# Patient Record
Sex: Male | Born: 1955 | Race: Black or African American | Hispanic: No | Marital: Married
Health system: Midwestern US, Community
[De-identification: ages and names within clinical notes are randomized; demographics above are authoritative.]

## PROBLEM LIST (undated history)

## (undated) DIAGNOSIS — Z8719 Personal history of other diseases of the digestive system: Secondary | ICD-10-CM

## (undated) HISTORY — PX: OTHER SURGICAL HISTORY: SHX169

## (undated) HISTORY — PX: COLONOSCOPY: SHX5424

---

## 2012-05-21 MED ORDER — OMEPRAZOLE 40 MG CAP, DELAYED RELEASE
40 mg | ORAL_CAPSULE | Freq: Every day | ORAL | Status: DC
Start: 2012-05-21 — End: 2015-10-23

## 2012-05-21 MED ORDER — SILDENAFIL 50 MG TAB
50 mg | ORAL_TABLET | ORAL | Status: DC | PRN
Start: 2012-05-21 — End: 2015-10-23

## 2012-05-21 NOTE — Progress Notes (Signed)
Establish care with Dr. Hayes.
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
## 2012-07-25 NOTE — Progress Notes (Signed)
Quick Note:    Advised pt per Dr.Hayes' note regarding recent xray. Patient verbalized understanding.  ______

## 2012-07-25 NOTE — Progress Notes (Signed)
Follow up for back pain.

## 2012-07-25 NOTE — Progress Notes (Signed)
Quick Note:    Please call patient. Xray normal, no fx.  ______

## 2012-07-25 NOTE — Progress Notes (Signed)
HISTORY OF PRESENT ILLNESS  Parker Warner is a 56 y.o. male.  HPI  GI Review  Patient has episodic nausea & vomiting.  Since last visit did receive previous PCP records.  He can't remember who the GI physician is.      Orthopedic Review  Parker Warner is a 56 y.o. male who presents do to chronic upper back/neck pain. This is a chronic problem, since last visit his previous PCP notes reviewed.  Little information regarding his back issue.  He reports symptoms are unchanged in the last 2 months.  Pain is sharp and located in upper/mid back.  Symptoms are intermittent.  Alleviating factors identifiable by patient are Celebrex (used samples his wife has).  He has used Aleve & Ibuprofen & Tylenol with sporadic improvement.  He reports having tried PT, last time about 3 yrs ago.      Endocrine Review  He is here to talk about hypogonadism.  He is on the following replacement medications: none and is experiencing the following side effects: none.  He was on T shots x 2.  He reports: no change in his libido or ED so stopped.  Lab review: labs reviewed and discussed with patient.         Current Outpatient Prescriptions   Medication Sig Dispense Refill   ??? sildenafil citrate (VIAGRA) 50 mg tablet Take 1 Tab by mouth as needed.  6 Tab  1   ??? omeprazole (PRILOSEC) 40 mg capsule Take 1 Cap by mouth daily.  30 Cap  2       No Known Allergies        Review of Systems   Constitutional: Negative for weight loss and malaise/fatigue.   Respiratory: Negative for cough and shortness of breath.    Cardiovascular: Negative for chest pain and palpitations.   Gastrointestinal: Positive for nausea and vomiting. Negative for abdominal pain, diarrhea, constipation, blood in stool and melena.   Psychiatric/Behavioral: Negative for depression. The patient is not nervous/anxious and does not have insomnia.        Physical Exam   Cardiovascular: Regular rhythm and normal heart sounds.    No murmur heard.  Pulmonary/Chest: Effort normal and breath  sounds normal. He has no wheezes. He has no rales.   Abdominal: Soft. Bowel sounds are normal. He exhibits no mass. There is no hepatosplenomegaly. There is no tenderness.   Musculoskeletal:        Thoracic back: He exhibits bony tenderness. He exhibits normal range of motion, no tenderness, no pain and no spasm.        Back:        BP 118/72   Pulse 62   Ht 5' 11.75" (1.822 m)   Wt 231 lb (104.781 kg)   BMI 31.55 kg/m2   SpO2 98%      ASSESSMENT and PLAN  Parker Warner was seen today for back pain.  Diagnoses and associated orders for this visit:    Vomiting- stable, pt unsure of previous GI he has seen, will try and call around to get records    Thoracic spine pain- chronic, no previous records to review, will start meds below & check labs  - celecoxib (CELEBREX) 200 mg capsule; Take 1 Cap by mouth two (2) times a day.  - celecoxib (CELEBREX) 200 mg capsule; Take 1 Cap by mouth two (2) times a day for 14 days.  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  Will hold off on retrying meds.  He would like to try Viagra first.  Will get labs & f/u at next visit.        Follow-up Disposition:  Return in about 3 months (around 10/24/2012) for FULL PHYSICAL.   Advised him to call back or return to office if symptoms worsen/change/persist.  Discussed expected course/resolution/complications of diagnosis in detail with patient.    Medication risks/benefits/costs/interactions/alternatives discussed with patient.  He was given an after visit summary which includes diagnoses, current medications, & vitals.  He expressed understanding with the diagnosis and plan.

## 2012-07-25 NOTE — Progress Notes (Signed)
Quick Note:    LMTCB  ______

## 2015-10-23 ENCOUNTER — Ambulatory Visit: Admit: 2015-10-23 | Payer: PRIVATE HEALTH INSURANCE | Attending: Internal Medicine

## 2015-10-23 DIAGNOSIS — G8929 Other chronic pain: Secondary | ICD-10-CM

## 2015-10-23 MED ORDER — NAPROXEN 500 MG TAB
500 mg | ORAL_TABLET | Freq: Two times a day (BID) | ORAL | 1 refills | Status: AC
Start: 2015-10-23 — End: ?

## 2015-10-23 MED ORDER — CYCLOBENZAPRINE 5 MG TAB
5 mg | ORAL_TABLET | Freq: Three times a day (TID) | ORAL | 1 refills | Status: AC
Start: 2015-10-23 — End: ?
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
## 2015-10-23 NOTE — Progress Notes (Signed)
To re-establish care. C/O back pain, recurring for past 1-1/2 years. NKI. No other known health issues.

## 2015-10-23 NOTE — Patient Instructions (Signed)
Healthy Upper Back: Exercises  Your Care Instructions  Here are some examples of exercises for your upper back. Start each exercise slowly. Ease off the exercise if you start to have pain.  Your doctor or physical therapist will tell you when you can start these exercises and which ones will work best for you.  How to do the exercises  Lower neck and upper back stretch    1. Stretch your arms out in front of your body. Clasp one hand on top of your other hand.  2. Gently reach out so that you feel your shoulder blades stretching away from each other.  3. Gently bend your head forward.  4. Hold for 15 to 30 seconds.  5. Repeat 2 to 4 times.  Midback stretch    Note: If you have knee pain, do not do this exercise.  1. Kneel on the floor, and sit back on your ankles.  2. Lean forward, place your hands on the floor, and stretch your arms out in front of you. Rest your head between your arms.  3. Gently push your chest toward the floor, reaching as far in front of you as possible.  4. Hold for 15 to 30 seconds.  5. Repeat 2 to 4 times.  Shoulder rolls    1. Sit comfortably with your feet shoulder-width apart. You can also do this exercise while standing.  2. Roll your shoulders up, then back, and then down in a smooth, circular motion.  3. Repeat 2 to 4 times.  Wall push-up    1. Stand against a wall with your feet about 12 to 24 inches back from the wall. If you feel any pain when you do this exercise, stand closer to the wall.  2. Place your hands on the wall slightly wider apart than your shoulders, and lean forward.  3. Gently lean your body toward the wall. Then push back to your starting position. Keep the motion smooth and controlled.  4. Repeat 8 to 12 times.  Resisted shoulder blade squeeze    Note: For this exercise, you will need elastic exercise material, such as surgical tubing or Thera-Band.  1. Sit or stand, holding the band in both hands in front of you. Keep your  elbows close to your sides, bent at a 90-degree angle. Your palms should face up.  2. Squeeze your shoulder blades together, and move your arms to the outside, stretching the band. Be sure to keep your elbows at your sides while you do this.  3. Relax.  4. Repeat 8 to 12 times.  Resisted rows    Note: For this exercise, you will need elastic exercise material, such as surgical tubing or Thera-Band.  1. Put the band around a solid object, such as a bedpost, at about waist level. Hold one end of the band in each hand.  2. With your elbows at your sides and bent to 90 degrees, pull the band back to move your shoulder blades toward each other. Return to the starting position.  3. Repeat 8 to 12 times.  Follow-up care is a key part of your treatment and safety. Be sure to make and go to all appointments, and call your doctor if you are having problems. It's also a good idea to know your test results and keep a list of the medicines you take.  Where can you learn more?  Go to http://www.healthwise.net/[facility]  Enter T680 in the search box to learn more about "Healthy Upper   Back: Exercises."  ?? 2006-2016 [employer]. Care instructions adapted under license by [employer] (which disclaims liability or warranty for this information). This care instruction is for use with your licensed healthcare professional. If you have questions about a medical condition or this instruction, always ask your healthcare professional. [employer] disclaims any warranty or liability for your use of this information.  Content Version: 11.0.578772; Current as of: Apr 06, 2015        Low Back Pain: Exercises  Your Care Instructions  Here are some examples of typical rehabilitation exercises for your condition. Start each exercise slowly. Ease off the exercise if you start to have pain.  Your doctor or physical therapist will tell you when you can start these  exercises and which ones will work best for you.  How to do the exercises  Press-up    6. Lie on your stomach, supporting your body with your forearms.  7. Press your elbows down into the floor to raise your upper back. As you do this, relax your stomach muscles and allow your back to arch without using your back muscles. As your press up, do not let your hips or pelvis come off the floor.  8. Hold for 15 to 30 seconds, then relax.  9. Repeat 2 to 4 times.  Alternate arm and leg (bird dog) exercise    Note: Do this exercise slowly. Try to keep your body straight at all times, and do not let one hip drop lower than the other.  6. Start on the floor, on your hands and knees.  7. Tighten your belly muscles.  8. Raise one leg off the floor, and hold it straight out behind you. Be careful not to let your hip drop down, because that will twist your trunk.  9. Hold for about 6 seconds, then lower your leg and switch to the other leg.  10. Repeat 8 to 12 times on each leg.  11. Over time, work up to holding for 10 to 30 seconds each time.  12. If you feel stable and secure with your leg raised, try raising the opposite arm straight out in front of you at the same time.  Knee-to-chest exercise    4. Lie on your back with your knees bent and your feet flat on the floor.  5. Bring one knee to your chest, keeping the other foot flat on the floor (or keeping the other leg straight, whichever feels better on your lower back).  6. Keep your lower back pressed to the floor. Hold for at least 15 to 30 seconds.  7. Relax, and lower the knee to the starting position.  8. Repeat with the other leg. Repeat 2 to 4 times with each leg.  9. To get more stretch, put your other leg flat on the floor while pulling your knee to your chest.  Curl-ups    5. Lie on the floor on your back with your knees bent at a 90-degree angle. Your feet should be flat on the floor, about 12 inches from your buttocks.   6. Cross your arms over your chest. If this bothers your neck, try putting your hands behind your neck (not your head), with your elbows spread apart.  7. Slowly tighten your belly muscles and raise your shoulder blades off the floor.  8. Keep your head in line with your body, and do not press your chin to your chest.  9. Hold this position for 1 or   2 seconds, then slowly lower yourself back down to the floor.  10. Repeat 8 to 12 times.  Pelvic tilt exercise    5. Lie on your back with your knees bent.  6. "Brace" your stomach. This means to tighten your muscles by pulling in and imagining your belly button moving toward your spine. You should feel like your back is pressing to the floor and your hips and pelvis are rocking back.  7. Hold for about 6 seconds while you breathe smoothly.  8. Repeat 8 to 12 times.  Heel dig bridging    4. Lie on your back with both knees bent and your ankles bent so that only your heels are digging into the floor. Your knees should be bent about 90 degrees.  5. Then push your heels into the floor, squeeze your buttocks, and lift your hips off the floor until your shoulders, hips, and knees are all in a straight line.  6. Hold for about 6 seconds as you continue to breathe normally, and then slowly lower your hips back down to the floor and rest for up to 10 seconds.  7. Do 8 to 12 repetitions.  Hamstring stretch in doorway    1. Lie on your back in a doorway, with one leg through the open door.  2. Slide your leg up the wall to straighten your knee. You should feel a gentle stretch down the back of your leg.  3. Hold the stretch for at least 15 to 30 seconds. Do not arch your back, point your toes, or bend either knee. Keep one heel touching the floor and the other heel touching the wall.  4. Repeat with your other leg.  5. Do 2 to 4 times for each leg.  Hip flexor stretch    1. Kneel on the floor with one knee bent and one leg behind you. Place  your forward knee over your foot. Keep your other knee touching the floor.  2. Slowly push your hips forward until you feel a stretch in the upper thigh of your rear leg.  3. Hold the stretch for at least 15 to 30 seconds. Repeat with your other leg.  4. Do 2 to 4 times on each side.  Wall sit    1. Stand with your back 10 to 12 inches away from a wall.  2. Lean into the wall until your back is flat against it.  3. Slowly slide down until your knees are slightly bent, pressing your lower back into the wall.  4. Hold for about 6 seconds, then slide back up the wall.  5. Repeat 8 to 12 times.  Follow-up care is a key part of your treatment and safety. Be sure to make and go to all appointments, and call your doctor if you are having problems. It's also a good idea to know your test results and keep a list of the medicines you take.  Where can you learn more?  Go to http://www.healthwise.net/GoodHelpConnections  Enter Z938 in the search box to learn more about "Low Back Pain: Exercises."  ?? 2006-2016 [employer]. Care instructions adapted under license by [facility] (which disclaims liability or warranty for this information). This care instruction is for use with your licensed healthcare professional. If you have questions about a medical condition or this instruction, always ask your healthcare professional. [employer] disclaims any warranty or liability for your use of this information.  Content Version: 11.0.578772; Current as of: Apr 06, 2015

## 2017-04-24 ENCOUNTER — Other Ambulatory Visit: Payer: Self-pay | Admitting: General Surgery

## 2017-04-24 NOTE — Pre-Procedure Instructions (Signed)
Cherlynn PoloJerome Cuadrado  04/24/2017      [facility] [phone] - [place] - [address] AT [facility] [address] [place] [address] Phone: [phone] Fax: [phone]    Your procedure is scheduled on June 18.  [facility].M[facility]  Call this number if you have problems the morning of surgery:  [phone]   Remember:  Do not eat food or drink liquids after midnight.  Take these medicines the morning of surgery with A SIP OF WATER Tylenol if needed  Stop taking aspirin, BC's, Goody's, herbal medications, Fish Oil, Ibuprofen, Advil, Motrin,Aleve, Naproxen, Anaprox, Vitamins   Do not wear jewelry, make-up or nail polish.  Do not wear lotions, powders, or perfumes, or deoderant.  Do not shave 48 hours prior to surgery.  Men may shave face and neck.  Do not bring valuables to the hospital.  [facility] is not responsible for any belongings or valuables.  Contacts, dentures or bridgework may not be worn into surgery.  Leave your suitcase in the car.  After surgery it may be brought to your room.  For patients admitted to the hospital, discharge time will be determined by your treatment team.  Patients discharged the day of surgery will not be allowed to drive home.    Special instructions:  [place] - Preparing for Surgery  Before surgery, you can play an important role.  Because skin is not sterile, your skin needs to be as free of germs as possible.  You can reduce the number of germs on you skin by washing with CHG (chlorahexidine gluconate) soap before surgery.  CHG is an antiseptic cleaner which kills germs and bonds with the skin to continue killing germs even after washing.  Please DO NOT use if you have an allergy to CHG or antibacterial soaps.  If your skin becomes reddened/irritated stop using the CHG and inform your nurse when you arrive at Short Stay.  Do not shave (including  legs and underarms) for at least 48 hours prior to the first CHG shower.  You may shave your face.  Please follow these instructions carefully:   1.  Shower with CHG Soap the night before surgery and the  morning of Surgery.  2.  If you choose to wash your hair, wash your hair first as usual with your   normal shampoo.  3.  After you shampoo, rinse your hair and body thoroughly to remove the  Shampoo.  4.  Use CHG as you would any other liquid soap.  You can apply chg directly  to the skin and wash gently with scrungie or a clean washcloth.  5.  Apply the CHG Soap to your body ONLY FROM THE NECK DOWN.   Do not use on open wounds or open sores.  Avoid contact with your eyes,       ears, mouth and genitals (private parts).  Wash genitals (private parts)   with your normal soap.  6.  Wash thoroughly, paying special attention to the area where your surgery will be performed.  7.  Thoroughly rinse your body with warm water from the neck down.  8.  DO NOT shower/wash with your normal soap after using and rinsing off  the CHG Soap.  9.  Pat yourself dry with a clean towel.            10.  Wear  clean pajamas.            11.  Place clean sheets on your bed the night of your first shower and do not  sleep with pets.  Day of Surgery  Do not apply any lotions/deoderants the morning of surgery.  Please wear clean clothes to the hospital/surgery center.     Please read over the following fact sheets that you were given. Pain Booklet, Coughing and Deep Breathing and Surgical Site Infection Prevention

## 2017-04-25 ENCOUNTER — Encounter (HOSPITAL_COMMUNITY): Payer: Self-pay

## 2017-04-25 ENCOUNTER — Encounter (HOSPITAL_COMMUNITY)
Admission: RE | Admit: 2017-04-25 | Discharge: 2017-04-25 | Disposition: A | Discharge status: Home / Self Care | Payer: Federal, State, Local not specified - PPO | Source: Ambulatory Visit | Attending: General Surgery | Admitting: General Surgery

## 2017-04-25 DIAGNOSIS — K449 Diaphragmatic hernia without obstruction or gangrene: Secondary | ICD-10-CM | POA: Diagnosis not present

## 2017-04-25 DIAGNOSIS — Z01812 Encounter for preprocedural laboratory examination: Secondary | ICD-10-CM | POA: Diagnosis not present

## 2017-04-25 HISTORY — DX: Personal history of other diseases of the digestive system: Z87.19

## 2017-04-25 LAB — BASIC METABOLIC PANEL
Anion gap: 5 (ref 5–15)
BUN: 12 mg/dL (ref 6–20)
CALCIUM: 8.8 mg/dL — AB (ref 8.9–10.3)
CO2: 23 mmol/L (ref 22–32)
CREATININE: 1.07 mg/dL (ref 0.61–1.24)
Chloride: 109 mmol/L (ref 101–111)
GFR calc Af Amer: >60 mL/min (ref >60)
GLUCOSE: 96 mg/dL (ref 65–99)
Potassium: 3.7 mmol/L (ref 3.5–5.1)
SODIUM: 137 mmol/L (ref 135–145)

## 2017-04-25 LAB — CBC
HCT: 44.4 % (ref 39.0–52.0)
Hemoglobin: 14.5 g/dL (ref 13.0–17.0)
MCH: 26.9 pg (ref 26.0–34.0)
MCHC: 32.7 g/dL (ref 30.0–36.0)
MCV: 82.2 fL (ref 78.0–100.0)
PLATELETS: 248 10*3/uL (ref 150–400)
RBC: 5.4 MIL/uL (ref 4.22–5.81)
RDW: 15.3 % (ref 11.5–15.5)
WBC: 6.2 10*3/uL (ref 4.0–10.5)

## 2017-04-25 NOTE — Progress Notes (Signed)
Pt now here for 0815 appointment.

## 2017-04-25 NOTE — Progress Notes (Addendum)
PCP is Dr. Renaye RakersVeita Bland- states that Dr Parke SimmersBland is aware he is having surgery.  Denies ever seeing a cardiologist. Denies ever having a stress test, card cath, or echo. Denies any chest pain, fever, cough, or shortness of breath. Instructed to drink Boost by 0330 on the day of surgery- voices understanding. (ERAS protocol)

## 2017-04-25 NOTE — Progress Notes (Signed)
Not here for PAT appointment left message for him to call me back.

## 2017-04-30 NOTE — Anesthesia Preprocedure Evaluation (Addendum)
Anesthesia Evaluation  Patient identified by MRN, date of birth, ID band Patient awake    Reviewed: Allergy & Precautions, H&P , Patient's Chart, lab work & pertinent test results, reviewed documented beta blocker date and time   Airway Mallampati: II  TM Distance: >3 FB Neck ROM: full    Dental no notable dental hx. (+) Dental Advidsory Given, Partial Upper, Missing   Pulmonary Current Smoker,    Pulmonary exam normal breath sounds clear to auscultation       Cardiovascular  Rhythm:regular Rate:Normal     Neuro/Psych    GI/Hepatic hiatal hernia,   Endo/Other    Renal/GU      Musculoskeletal   Abdominal   Peds  Hematology   Anesthesia Other Findings   Reproductive/Obstetrics                           Anesthesia Physical Anesthesia Plan  ASA: II  Anesthesia Plan: General   Post-op Pain Management:    Induction: Intravenous  PONV Risk Score and Plan: 1 and Ondansetron, Dexamethasone and Propofol  Airway Management Planned: Oral ETT  Additional Equipment:   Intra-op Plan:   Post-operative Plan: Extubation in OR  Informed Consent: I have reviewed the patients History and Physical, chart, labs and discussed the procedure including the risks, benefits and alternatives for the proposed anesthesia with the patient or authorized representative who has indicated his/her understanding and acceptance.   Dental Advisory Given  Plan Discussed with: CRNA, Surgeon and Anesthesiologist  Anesthesia Plan Comments: (  )       Anesthesia Quick Evaluation

## 2017-05-01 ENCOUNTER — Ambulatory Visit (HOSPITAL_COMMUNITY): Payer: Federal, State, Local not specified - PPO | Admitting: Anesthesiology

## 2017-05-01 ENCOUNTER — Encounter (HOSPITAL_COMMUNITY): Payer: Self-pay | Admitting: *Deleted

## 2017-05-01 ENCOUNTER — Encounter (HOSPITAL_COMMUNITY)
Admission: RE | Disposition: A | Discharge status: Home / Self Care | Payer: Self-pay | Source: Ambulatory Visit | Attending: General Surgery

## 2017-05-01 ENCOUNTER — Observation Stay (HOSPITAL_COMMUNITY)
Admission: RE | Admit: 2017-05-01 | Discharge: 2017-05-02 | Disposition: A | Discharge status: Home / Self Care | Payer: Federal, State, Local not specified - PPO | Source: Ambulatory Visit | Attending: General Surgery | Admitting: General Surgery

## 2017-05-01 DIAGNOSIS — F17200 Nicotine dependence, unspecified, uncomplicated: Secondary | ICD-10-CM | POA: Diagnosis not present

## 2017-05-01 DIAGNOSIS — K429 Umbilical hernia without obstruction or gangrene: Secondary | ICD-10-CM | POA: Diagnosis not present

## 2017-05-01 HISTORY — PX: INSERTION OF MESH: SHX5868

## 2017-05-01 HISTORY — PX: UMBILICAL HERNIA REPAIR: SHX196

## 2017-05-01 SURGERY — REPAIR, HERNIA, UMBILICAL, LAPAROSCOPIC
Anesthesia: General | Site: Abdomen

## 2017-05-01 MED ORDER — PROPOFOL 10 MG/ML IV BOLUS
INTRAVENOUS | Status: AC
  Filled 2017-05-01: qty 20

## 2017-05-01 MED ORDER — PHENYLEPHRINE HCL 10 MG/ML IJ SOLN
INTRAMUSCULAR | Status: DC | PRN
  Administered 2017-05-01 (×2): 80 ug via INTRAVENOUS

## 2017-05-01 MED ORDER — ONDANSETRON HCL 4 MG/2ML IJ SOLN
INTRAMUSCULAR | Status: DC | PRN
  Administered 2017-05-01: 4 mg via INTRAVENOUS

## 2017-05-01 MED ORDER — ONDANSETRON HCL 4 MG/2ML IJ SOLN: 4.0000 mg | Freq: Four times a day (QID) | INTRAMUSCULAR | Status: DC | PRN

## 2017-05-01 MED ORDER — MIDAZOLAM HCL 2 MG/2ML IJ SOLN
INTRAMUSCULAR | Status: AC
  Filled 2017-05-01: qty 2

## 2017-05-01 MED ORDER — EPHEDRINE SULFATE 50 MG/ML IJ SOLN
INTRAMUSCULAR | Status: DC | PRN
  Administered 2017-05-01: 10 mg via INTRAVENOUS

## 2017-05-01 MED ORDER — METHOCARBAMOL 500 MG PO TABS: 500.0000 mg | ORAL_TABLET | Freq: Four times a day (QID) | ORAL | Status: DC | PRN

## 2017-05-01 MED ORDER — DEXAMETHASONE SODIUM PHOSPHATE 10 MG/ML IJ SOLN
INTRAMUSCULAR | Status: DC | PRN
  Administered 2017-05-01: 10 mg via INTRAVENOUS

## 2017-05-01 MED ORDER — CELECOXIB 200 MG PO CAPS
200.0000 mg | ORAL_CAPSULE | ORAL | Status: AC
  Administered 2017-05-01: 200 mg via ORAL
  Filled 2017-05-01: qty 1

## 2017-05-01 MED ORDER — EPHEDRINE 5 MG/ML INJ
INTRAVENOUS | Status: AC
  Filled 2017-05-01: qty 10

## 2017-05-01 MED ORDER — LACTATED RINGERS IV SOLN
INTRAVENOUS | Status: DC | PRN
  Administered 2017-05-01 (×2): via INTRAVENOUS

## 2017-05-01 MED ORDER — BUPIVACAINE-EPINEPHRINE 0.25% -1:200000 IJ SOLN
INTRAMUSCULAR | Status: DC | PRN
  Administered 2017-05-01: 9 mL

## 2017-05-01 MED ORDER — SODIUM CHLORIDE 0.9 % IV SOLN
INTRAVENOUS | Status: DC
  Administered 2017-05-01: 12:00:00 via INTRAVENOUS

## 2017-05-01 MED ORDER — ACETAMINOPHEN 500 MG PO TABS
1000.0000 mg | ORAL_TABLET | ORAL | Status: AC
  Administered 2017-05-01: 1000 mg via ORAL
  Filled 2017-05-01: qty 2

## 2017-05-01 MED ORDER — MIDAZOLAM HCL 5 MG/5ML IJ SOLN
INTRAMUSCULAR | Status: DC | PRN
  Administered 2017-05-01: 2 mg via INTRAVENOUS

## 2017-05-01 MED ORDER — LIDOCAINE 2% (20 MG/ML) 5 ML SYRINGE
INTRAMUSCULAR | Status: AC
  Filled 2017-05-01: qty 5

## 2017-05-01 MED ORDER — ONDANSETRON 4 MG PO TBDP
4.0000 mg | ORAL_TABLET | Freq: Four times a day (QID) | ORAL | Status: DC | PRN
Start: 2017-05-01 — End: 2017-05-02

## 2017-05-01 MED ORDER — FENTANYL CITRATE (PF) 250 MCG/5ML IJ SOLN
INTRAMUSCULAR | Status: AC
  Filled 2017-05-01: qty 5

## 2017-05-01 MED ORDER — LIDOCAINE HCL (CARDIAC) 20 MG/ML IV SOLN
INTRAVENOUS | Status: DC | PRN
  Administered 2017-05-01: 100 mg via INTRAVENOUS

## 2017-05-01 MED ORDER — KETOROLAC TROMETHAMINE 15 MG/ML IJ SOLN
15.0000 mg | Freq: Four times a day (QID) | INTRAMUSCULAR | Status: DC | PRN
  Administered 2017-05-01: 15 mg via INTRAVENOUS

## 2017-05-01 MED ORDER — OXYCODONE HCL 5 MG PO TABS: 5.0000 mg | ORAL_TABLET | ORAL | Status: DC | PRN

## 2017-05-01 MED ORDER — DEXAMETHASONE SODIUM PHOSPHATE 10 MG/ML IJ SOLN
INTRAMUSCULAR | Status: AC
  Filled 2017-05-01: qty 1

## 2017-05-01 MED ORDER — PHENYLEPHRINE 40 MCG/ML (10ML) SYRINGE FOR IV PUSH (FOR BLOOD PRESSURE SUPPORT)
PREFILLED_SYRINGE | INTRAVENOUS | Status: AC
  Filled 2017-05-01: qty 10

## 2017-05-01 MED ORDER — PROPOFOL 10 MG/ML IV BOLUS
INTRAVENOUS | Status: DC | PRN
  Administered 2017-05-01: 175 mg via INTRAVENOUS

## 2017-05-01 MED ORDER — ENOXAPARIN SODIUM 40 MG/0.4ML ~~LOC~~ SOLN: 40.0000 mg | SUBCUTANEOUS | Status: DC

## 2017-05-01 MED ORDER — CEFAZOLIN SODIUM-DEXTROSE 2-4 GM/100ML-% IV SOLN
2.0000 g | INTRAVENOUS | Status: AC
  Administered 2017-05-01: 2 g via INTRAVENOUS
  Filled 2017-05-01: qty 100

## 2017-05-01 MED ORDER — HYDROMORPHONE HCL 1 MG/ML IJ SOLN
0.2500 mg | INTRAMUSCULAR | Status: DC | PRN
  Administered 2017-05-01: 0.5 mg via INTRAVENOUS

## 2017-05-01 MED ORDER — ONDANSETRON HCL 4 MG/2ML IJ SOLN
INTRAMUSCULAR | Status: AC
  Filled 2017-05-01: qty 2

## 2017-05-01 MED ORDER — HYDROMORPHONE HCL 1 MG/ML IJ SOLN
INTRAMUSCULAR | Status: AC
  Administered 2017-05-01: 0.5 mg via INTRAVENOUS
  Filled 2017-05-01: qty 1

## 2017-05-01 MED ORDER — MORPHINE SULFATE (PF) 4 MG/ML IV SOLN
2.0000 mg | INTRAVENOUS | Status: DC | PRN
  Administered 2017-05-01 – 2017-05-02 (×3): 2 mg via INTRAVENOUS
  Filled 2017-05-01 (×3): qty 1

## 2017-05-01 MED ORDER — KETOROLAC TROMETHAMINE 15 MG/ML IJ SOLN
INTRAMUSCULAR | Status: AC
  Filled 2017-05-01: qty 1

## 2017-05-01 MED ORDER — FENTANYL CITRATE (PF) 100 MCG/2ML IJ SOLN
INTRAMUSCULAR | Status: DC | PRN
  Administered 2017-05-01: 50 ug via INTRAVENOUS
  Administered 2017-05-01: 200 ug via INTRAVENOUS

## 2017-05-01 MED ORDER — 0.9 % SODIUM CHLORIDE (POUR BTL) OPTIME
TOPICAL | Status: DC | PRN
  Administered 2017-05-01: 1000 mL

## 2017-05-01 MED ORDER — ACETAMINOPHEN 500 MG PO TABS
1000.0000 mg | ORAL_TABLET | Freq: Four times a day (QID) | ORAL | Status: DC
  Administered 2017-05-01 – 2017-05-02 (×4): 1000 mg via ORAL
  Filled 2017-05-01 (×4): qty 2

## 2017-05-01 MED ORDER — ROCURONIUM BROMIDE 10 MG/ML (PF) SYRINGE
PREFILLED_SYRINGE | INTRAVENOUS | Status: AC
  Filled 2017-05-01: qty 5

## 2017-05-01 MED ORDER — GABAPENTIN 300 MG PO CAPS
300.0000 mg | ORAL_CAPSULE | ORAL | Status: AC
  Administered 2017-05-01: 300 mg via ORAL
  Filled 2017-05-01: qty 1

## 2017-05-01 MED ORDER — ROCURONIUM BROMIDE 100 MG/10ML IV SOLN
INTRAVENOUS | Status: DC | PRN
  Administered 2017-05-01: 50 mg via INTRAVENOUS

## 2017-05-01 MED ORDER — SUGAMMADEX SODIUM 200 MG/2ML IV SOLN
INTRAVENOUS | Status: DC | PRN
  Administered 2017-05-01: 300 mg via INTRAVENOUS

## 2017-05-01 MED ORDER — BUPIVACAINE-EPINEPHRINE (PF) 0.25% -1:200000 IJ SOLN
INTRAMUSCULAR | Status: AC
  Filled 2017-05-01: qty 30

## 2017-05-01 MED ORDER — GLYCOPYRROLATE 0.2 MG/ML IJ SOLN
INTRAMUSCULAR | Status: DC | PRN
  Administered 2017-05-01: 0.2 mg via INTRAVENOUS

## 2017-05-01 MED ORDER — SIMETHICONE 80 MG PO CHEW: 40.0000 mg | CHEWABLE_TABLET | Freq: Four times a day (QID) | ORAL | Status: DC | PRN

## 2017-05-01 SURGICAL SUPPLY — 50 items
APPLIER CLIP 5 13 M/L LIGAMAX5 (MISCELLANEOUS)
CANISTER SUCT 3000ML PPV (MISCELLANEOUS) IMPLANT
CHLORAPREP W/TINT 26ML (MISCELLANEOUS) ×3 IMPLANT
CLIP APPLIE 5 13 M/L LIGAMAX5 (MISCELLANEOUS) IMPLANT
CLOSURE WOUND 1/2 X4 (GAUZE/BANDAGES/DRESSINGS) ×1
COVER SURGICAL LIGHT HANDLE (MISCELLANEOUS) ×3 IMPLANT
DERMABOND ADVANCED (GAUZE/BANDAGES/DRESSINGS) ×2
DERMABOND ADVANCED .7 DNX12 (GAUZE/BANDAGES/DRESSINGS) ×1 IMPLANT
DEVICE RELIATACK FIXATION (MISCELLANEOUS) ×3 IMPLANT
DEVICE TROCAR PUNCTURE CLOSURE (ENDOMECHANICALS) IMPLANT
DRAPE INCISE IOBAN 66X45 STRL (DRAPES) ×3 IMPLANT
ELECT REM PT RETURN 9FT ADLT (ELECTROSURGICAL) ×3
ELECTRODE REM PT RTRN 9FT ADLT (ELECTROSURGICAL) ×1 IMPLANT
GAUZE SPONGE 2X2 8PLY STRL LF (GAUZE/BANDAGES/DRESSINGS) ×1 IMPLANT
GLOVE BIO SURGEON STRL SZ7 (GLOVE) ×6 IMPLANT
GLOVE BIO SURGEON STRL SZ7.5 (GLOVE) ×3 IMPLANT
GLOVE BIOGEL PI IND STRL 6.5 (GLOVE) ×1 IMPLANT
GLOVE BIOGEL PI IND STRL 7.5 (GLOVE) IMPLANT
GLOVE BIOGEL PI INDICATOR 6.5 (GLOVE) ×2
GLOVE BIOGEL PI INDICATOR 7.5 (GLOVE)
GLOVE INDICATOR 7.5 STRL GRN (GLOVE) ×3 IMPLANT
GLOVE SURG SS PI 6.0 STRL IVOR (GLOVE) ×3 IMPLANT
GOWN STRL REUS W/ TWL LRG LVL3 (GOWN DISPOSABLE) ×3 IMPLANT
GOWN STRL REUS W/TWL LRG LVL3 (GOWN DISPOSABLE) ×6
KIT BASIN OR (CUSTOM PROCEDURE TRAY) ×3 IMPLANT
KIT ROOM TURNOVER OR (KITS) ×3 IMPLANT
MARKER SKIN DUAL TIP RULER LAB (MISCELLANEOUS) ×3 IMPLANT
MESH VENTRALIGHT ST 6IN CRC (Mesh General) ×3 IMPLANT
NEEDLE SPNL 22GX3.5 QUINCKE BK (NEEDLE) ×3 IMPLANT
NS IRRIG 1000ML POUR BTL (IV SOLUTION) ×3 IMPLANT
PAD ARMBOARD 7.5X6 YLW CONV (MISCELLANEOUS) ×3 IMPLANT
RELOAD RELIATACK 10 (MISCELLANEOUS) IMPLANT
RELOAD RELIATACK 5 (MISCELLANEOUS) ×12 IMPLANT
SCISSORS LAP 5X35 DISP (ENDOMECHANICALS) ×3 IMPLANT
SET IRRIG TUBING LAPAROSCOPIC (IRRIGATION / IRRIGATOR) IMPLANT
SHEARS HARMONIC ACE PLUS 36CM (ENDOMECHANICALS) IMPLANT
SLEEVE ENDOPATH XCEL 5M (ENDOMECHANICALS) ×6 IMPLANT
SPONGE GAUZE 2X2 STER 10/PKG (GAUZE/BANDAGES/DRESSINGS) ×2
STRIP CLOSURE SKIN 1/2X4 (GAUZE/BANDAGES/DRESSINGS) ×2 IMPLANT
SUT MNCRL AB 4-0 PS2 18 (SUTURE) IMPLANT
SUT PROLENE 0 CT 1 CR/8 (SUTURE) IMPLANT
SUT VICRYL 0 UR6 27IN ABS (SUTURE) ×3 IMPLANT
TAPE CLOTH SOFT 2X10 (GAUZE/BANDAGES/DRESSINGS) ×3 IMPLANT
TOWEL OR 17X24 6PK STRL BLUE (TOWEL DISPOSABLE) ×3 IMPLANT
TOWEL OR 17X26 10 PK STRL BLUE (TOWEL DISPOSABLE) IMPLANT
TRAY FOLEY W/METER SILVER 14FR (SET/KITS/TRAYS/PACK) ×3 IMPLANT
TRAY LAPAROSCOPIC MC (CUSTOM PROCEDURE TRAY) ×3 IMPLANT
TROCAR XCEL NON-BLD 11X100MML (ENDOMECHANICALS) IMPLANT
TROCAR XCEL NON-BLD 5MMX100MML (ENDOMECHANICALS) ×3 IMPLANT
TUBING INSUFFLATION (TUBING) ×3 IMPLANT

## 2017-05-01 NOTE — Discharge Instructions (Signed)
[facility] LAPAROSCOPIC SURGERY: POST OP INSTRUCTIONS  Always review your discharge instruction sheet given to you by the facility where your surgery was performed. IF YOU HAVE DISABILITY OR FAMILY LEAVE FORMS, YOU MUST BRING THEM TO THE OFFICE FOR PROCESSING.   DO NOT GIVE THEM TO YOUR DOCTOR.  1. A prescription for pain medication may be given to you upon discharge.  Take your pain medication as prescribed, if needed.  If narcotic pain medicine is not needed, then you may take acetaminophen (Tylenol), naprosyn (Alleve), or ibuprofen (Advil) as needed. 2. Take your usually prescribed medications unless otherwise directed. 3. If you need a refill on your pain medication, please contact your pharmacy.  They will contact our office to request authorization. Prescriptions will not be filled after 5pm or on week-ends. 4. You should follow a light diet the first few days after arrival home, such as soup and crackers, etc.  Be sure to include lots of fluids daily. 5. Most patients will experience some swelling and bruising in the area of the incisions.  Ice packs will help.  Swelling and bruising can take several days to resolve.  6. It is common to experience some constipation if taking pain medication after surgery.  Increasing fluid intake and taking a stool softener (such as Colace) will usually help or prevent this problem from occurring.  A mild laxative (Milk of Magnesia or Miralax) should be taken according to package instructions if there are no bowel movements after 48 hours. 7. Unless discharge instructions indicate otherwise, you may remove your bandages 48 hours after surgery, and you may shower at that time.  You may have steri-strips (small skin tapes) in place directly over the incision.  These strips should be left on the skin for 7-10 days.  If your surgeon used skin glue on the incision, you may shower in 24 hours.  The glue will flake  off over the next 2-3 weeks.  Any sutures or staples will be removed at the office during your follow-up visit. 8. ACTIVITIES:  You may resume regular (light) daily activities beginning the next day--such as daily self-care, walking, climbing stairs--gradually increasing activities as tolerated.  You may have sexual intercourse when it is comfortable.  Refrain from any heavy lifting or straining until approved by your doctor. a. You may drive when you are no longer taking prescription pain medication, you can comfortably wear a seatbelt, and you can safely maneuver your car and apply brakes. b. RETURN TO WORK:  __________________________________________________________ 9. You should see your doctor in the office for a follow-up appointment approximately 2-3 weeks after your surgery.  Make sure that you call for this appointment within a day or two after you arrive home to insure a convenient appointment time. 10. OTHER INSTRUCTIONS: __________________________________________________________________________________________________________________________ __________________________________________________________________________________________________________________________ WHEN TO CALL YOUR DOCTOR: 1. Fever over 101.0 2. Inability to urinate 3. Continued bleeding from incision. 4. Increased pain, redness, or drainage from the incision. 5. Increasing abdominal pain  The clinic staff is available to answer your questions during regular business hours.  Please don't hesitate to call and ask to speak to one of the nurses for clinical concerns.  If you have a medical emergency, go to the nearest emergency room or call 911.  A surgeon from [facility] is always on call at the hospital. [address], [address], [place], [place]  [address] ? [address] [address], [place], [place]   [address] [phone] ? [phone] ? [phone] Web site: [email or web address]  

## 2017-05-01 NOTE — Transfer of Care (Signed)
Immediate Anesthesia Transfer of Care Note  Patient: Luis Roman  Procedure(s) Performed: Procedure(s): LAPAROSCOPIC UMBILICAL HERNIA REPAIR (N/A) INSERTION OF MESH (N/A)  Patient Location: PACU  Anesthesia Type:General  Level of Consciousness: awake, alert  and oriented  Airway & Oxygen Therapy: Patient Spontanous Breathing and Patient connected to nasal cannula oxygen  Post-op Assessment: Report given to RN, Post -op Vital signs reviewed and stable and Patient moving all extremities X 4  Post vital signs: Reviewed and stable  Last Vitals:  Vitals:   05/01/17 0640 05/01/17 0641  BP:  136/90  Pulse:  (!) 52  Resp:  20  Temp: 36.8 C     Last Pain:  Vitals:   05/01/17 0640  TempSrc: Oral         Complications: No apparent anesthesia complications

## 2017-05-01 NOTE — Anesthesia Procedure Notes (Signed)
Procedure Name: Intubation Date/Time: 05/01/2017 7:39 AM Performed by: Neldon Newport Pre-anesthesia Checklist: Timeout performed, Patient being monitored, Suction available, Emergency Drugs available and Patient identified Patient Re-evaluated:Patient Re-evaluated prior to inductionOxygen Delivery Method: Circle system utilized Preoxygenation: Pre-oxygenation with 100% oxygen Intubation Type: IV induction Ventilation: Oral airway inserted - appropriate to patient size and Mask ventilation without difficulty Laryngoscope Size: Mac, 4 and Glidescope Grade View: Grade III Tube type: Oral Tube size: 7.5 mm Number of attempts: 2 Placement Confirmation: breath sounds checked- equal and bilateral,  positive ETCO2 and ETT inserted through vocal cords under direct vision Secured at: 23 cm Tube secured with: Tape Dental Injury: Teeth and Oropharynx as per pre-operative assessment  Difficulty Due To: Difficult Airway- due to anterior larynx

## 2017-05-01 NOTE — Progress Notes (Signed)
Dr Dwain SarnaWakefield called at [phone] to clarify post op order - pt stated to RN that he was to stay overnight- current order is an enhanced sty- confirmed w/ Dr Dwain SarnaWakefield that order should be changed to an obs status

## 2017-05-01 NOTE — Interval H&P Note (Signed)
History and Physical Interval Note:  05/01/2017 7:14 AM  Luis PoloJerome Roman  has presented today for surgery, with the diagnosis of UMBILICAL HERNIA  The various methods of treatment have been discussed with the patient and family. After consideration of risks, benefits and other options for treatment, the patient has consented to  Procedure(s): LAPAROSCOPIC UMBILICAL HERNIA REPAIR WITH MESH (N/A) as a surgical intervention .  The patient's history has been reviewed, patient examined, no change in status, stable for surgery.  I have reviewed the patient's chart and labs.  Questions were answered to the patient's satisfaction.     Annemarie Sebree

## 2017-05-01 NOTE — H&P (Signed)
  [age] yom referred by Dr Parke SimmersBland for umbilical bulge. he has this for about a month. it is always present. no other medical problems. smokes cigars occasionally. no issues with bms. he is retired.    Past Surgical History  Tonsillectomy   Allergies  No Known Drug Allergies   Medication History No Current Medications Medications Reconciled  Social History  Alcohol use  Occasional alcohol use. Caffeine use  Coffee, Tea. No drug use  Tobacco use  Current some day smoker.  Family History  First Degree Relatives  No pertinent family history   Other Problems  Back Pain    Review of Systems  General Present- Weight Gain. Not Present- Appetite Loss, Chills, Fatigue, Fever, Night Sweats and Weight Loss. Skin Not Present- Change in Wart/Mole, Dryness, Hives, Jaundice, New Lesions, Non-Healing Wounds, Rash and Ulcer. HEENT Present- Hearing Loss. Not Present- Earache, Hoarseness, Nose Bleed, Oral Ulcers, Ringing in the Ears, Seasonal Allergies, Sinus Pain, Sore Throat, Visual Disturbances, Wears glasses/contact lenses and Yellow Eyes. Respiratory Present- Snoring. Not Present- Bloody sputum, Chronic Cough, Difficulty Breathing and Wheezing. Breast Not Present- Breast Mass, Breast Pain, Nipple Discharge and Skin Changes. Cardiovascular Not Present- Chest Pain, Difficulty Breathing Lying Down, Leg Cramps, Palpitations, Rapid Heart Rate, Shortness of Breath and Swelling of Extremities. Gastrointestinal Present- Abdominal Pain. Not Present- Bloating, Bloody Stool, Change in Bowel Habits, Chronic diarrhea, Constipation, Difficulty Swallowing, Excessive gas, Gets full quickly at meals, Hemorrhoids, Indigestion, Nausea, Rectal Pain and Vomiting. Male Genitourinary Not Present- Blood in Urine, Change in Urinary Stream, Frequency, Impotence, Nocturia, Painful Urination, Urgency and Urine Leakage.  Vitals Weight: 258.5 lb Height: 72in Body Surface Area: 2.38 m Body Mass Index:  35.06 kg/m  Temp.: 98.70F(Oral)  Pulse: 62 (Regular)  BP: 140/90 (Sitting, Left Arm, Standard) Physical Exam  General Mental Status-Alert. Orientation-Oriented X3. Chest and Lung Exam Chest and lung exam reveals -on auscultation, normal breath sounds, no adventitious sounds and normal vocal resonance. Cardiovascular Cardiovascular examination reveals -normal heart sounds, regular rate and rhythm with no murmurs. Abdomen Note: soft nt/nd 1-2 cm umbilical hernia mildly tender   Assessment & Plan  UMBILICAL HERNIA (K42.9) Story: Laparoscopic umbilical hernia repair with mesh He feels like he has small hernia but difficult to tell given tenderness and habitus. I think with habitus and activity level that best plan is lap umbo hernia repair with mesh. he also has some diastasis above this. discussed surgery, recovery and risks. We did discuss observation and risks with that but he desires surgery

## 2017-05-01 NOTE — Op Note (Signed)
Preoperative diagnosis: Umbilical hernia and diastasis recti Postoperative diagnosis: Same as above Procedure: Laparoscopic umbilical hernia repair with 15 cm Ventralight mesh Surgeon: Dr. Harden MoMatt Arinze Rivadeneira Anesthesia: Gen. Estimated blood loss: Minimal Specimens: None Drains: None Complications: None Sponge and needle count was correct 2 at end of operation Disposition to recovery in stable condition  Indications: This is a 61 year old male who has a symptomatic umbilical hernia. This is mostly out at this point. It does cause him some discomfort. Due to his body habitus and diastasis recti we discussed a laparoscopic umbilical hernia repair with mesh.  Procedure: After informed consent was obtained the patient was taken to the operating room. He was given antibiotics. SCDs were placed. He was placed under general anesthesia without complication. An orogastric tube and Foley catheter were placed. He was then prepped and draped in the standard sterile surgical fashion. Surgical timeout was then performed.  I infiltrated Marcaine in the left upper quadrant. I made an incision and entered into the peritoneal cavity with a 5 mm Optiview trocar. There was no evidence of an entry injury. I then placed 2 further 5 mm trocars in the left abdomen under direct vision without complication. He was noted to have about a 2 cm hernia with a diastasis above this. I did take down the falciform ligament with cautery in order to place the mesh. I elected to place a 15 cm ventralight mesh. I placed 0 Vicryl sutures in the 4 cardinal positions around the mesh. I then rolled this up and inserted it into my inferior most trocar site. I laid this flat. I used the Endo Close device to then bring the mesh up to the abdominal wall. The sutures were tied down. I used a Reliatack device to then secure the mesh edges all around. I did place an additional suture in the left lateral position. The mesh was in good position. This  adequately covered the defect. Hemostasis is observed. I then removed all the trocars. The abdomen was desufflated. I then closed using 4 Monocryl and glue. He tolerated this well as extubated and transferred to the recovery room stable.

## 2017-05-02 ENCOUNTER — Encounter (HOSPITAL_COMMUNITY): Payer: Self-pay | Admitting: General Surgery

## 2017-05-02 DIAGNOSIS — K429 Umbilical hernia without obstruction or gangrene: Secondary | ICD-10-CM | POA: Diagnosis not present

## 2017-05-02 MED ORDER — METHOCARBAMOL 500 MG PO TABS: 500.0000 mg | ORAL_TABLET | Freq: Four times a day (QID) | ORAL | 0 refills | Status: AC | PRN

## 2017-05-02 MED ORDER — OXYCODONE HCL 5 MG PO TABS: 5.0000 mg | ORAL_TABLET | ORAL | 0 refills | Status: AC | PRN

## 2017-05-02 NOTE — Progress Notes (Signed)
Discharge home. Home discharge instruction given, no question verbalized. 

## 2017-05-02 NOTE — Discharge Summary (Signed)
Physician Discharge Summary  Patient ID: Luis PoloJerome Ridlon MRN: [id number] DOB/AGE: 63/07/1956 63 y.o.  Admit date: 05/01/2017 Discharge date: 05/02/2017  Admission Diagnoses: Umbilical hernia  Discharge Diagnoses:  Active Problems:   Umbilical hernia   Discharged Condition: good  Hospital Course: [age] yom underwent laparoscopic umbilical hernia repair with mesh.  Did well and will be discharged today  Consults: None  Significant Diagnostic Studies: none  Treatments: surgery: lap umbo hernia repair with mesh  Discharge Exam: Blood pressure 132/75, pulse (!) 58, temperature 98.2 F (36.8 C), temperature source Oral, resp. rate 18, height 6' (1.829 m), weight 116.2 kg (256 lb 2.8 oz), SpO2 99 %. GI: soft approp tender incisions clean  Disposition: home  Allergies as of 05/02/2017      Reactions   No Known Allergies       Medication List    TAKE these medications   acetaminophen 325 MG tablet Commonly known as:  TYLENOL Take 325-650 mg by mouth every 6 (six) hours as needed (for pain.).   methocarbamol 500 MG tablet Commonly known as:  ROBAXIN Take 1 tablet (500 mg total) by mouth every 6 (six) hours as needed for muscle spasms.   multivitamin with minerals Tabs tablet Take 1 tablet by mouth 3 (three) times a week.   naproxen sodium 220 MG tablet Commonly known as:  ANAPROX Take 220-440 mg by mouth 2 (two) times daily as needed (for pain.).   oxyCODONE 5 MG immediate release tablet Commonly known as:  Oxy IR/ROXICODONE Take 1 tablet (5 mg total) by mouth every 4 (four) hours as needed for moderate pain.      Follow-up Information    Emelia LoronWakefield, Jacorion Klem, MD In 3 weeks.   Specialty:  General Surgery Contact information: [address] [place] [address] [phone]           Signed: Emelia LoronWAKEFIELD,Darenda Fike 05/02/2017, 7:25 AM

## 2017-05-02 NOTE — Progress Notes (Signed)
Patient ID: Luis Roman, male   DOB: 11/09/1956, 61 y.o.   MRN: [id number] [facility] reviewed day of dc, postop rx given

## 2017-05-08 NOTE — Anesthesia Postprocedure Evaluation (Signed)
Anesthesia Post Note  Patient: Luis Roman  Procedure(s) Performed: Procedure(s) (LRB): LAPAROSCOPIC UMBILICAL HERNIA REPAIR (N/A) INSERTION OF MESH (N/A)     Patient location during evaluation: PACU Anesthesia Type: General Level of consciousness: awake and alert Pain management: pain level controlled Vital Signs Assessment: post-procedure vital signs reviewed and stable Respiratory status: spontaneous breathing, nonlabored ventilation, respiratory function stable and patient connected to nasal cannula oxygen Cardiovascular status: blood pressure returned to baseline and stable Postop Assessment: no signs of nausea or vomiting Anesthetic complications: no    Last Vitals:  Vitals:   05/02/17 0235 05/02/17 0615  BP: (!) 147/84 132/75  Pulse: (!) 53 (!) 58  Resp: 18 18  Temp: 36.6 C 36.8 C    Last Pain:  Vitals:   05/02/17 0908  TempSrc:   PainSc: 3                  Shellene Sweigert EDWARD

## 2017-09-27 ENCOUNTER — Other Ambulatory Visit: Payer: Self-pay | Admitting: Family Medicine

## 2017-09-27 ENCOUNTER — Ambulatory Visit
Admission: RE | Admit: 2017-09-27 | Discharge: 2017-09-27 | Disposition: A | Discharge status: Home / Self Care | Payer: Federal, State, Local not specified - PPO | Source: Ambulatory Visit | Attending: Family Medicine | Admitting: Family Medicine

## 2017-09-27 DIAGNOSIS — M545 Low back pain: Secondary | ICD-10-CM

## 2019-02-07 IMAGING — DX DG LUMBAR SPINE COMPLETE 4+V
5 series · 5 of 5 positions shown · non-contrast
Comparison: None.

CLINICAL DATA: Low back pain.

EXAM:
LUMBAR SPINE - COMPLETE 4+ VIEW

[dg lumbar spine complete 4 +v (1 of 5)]
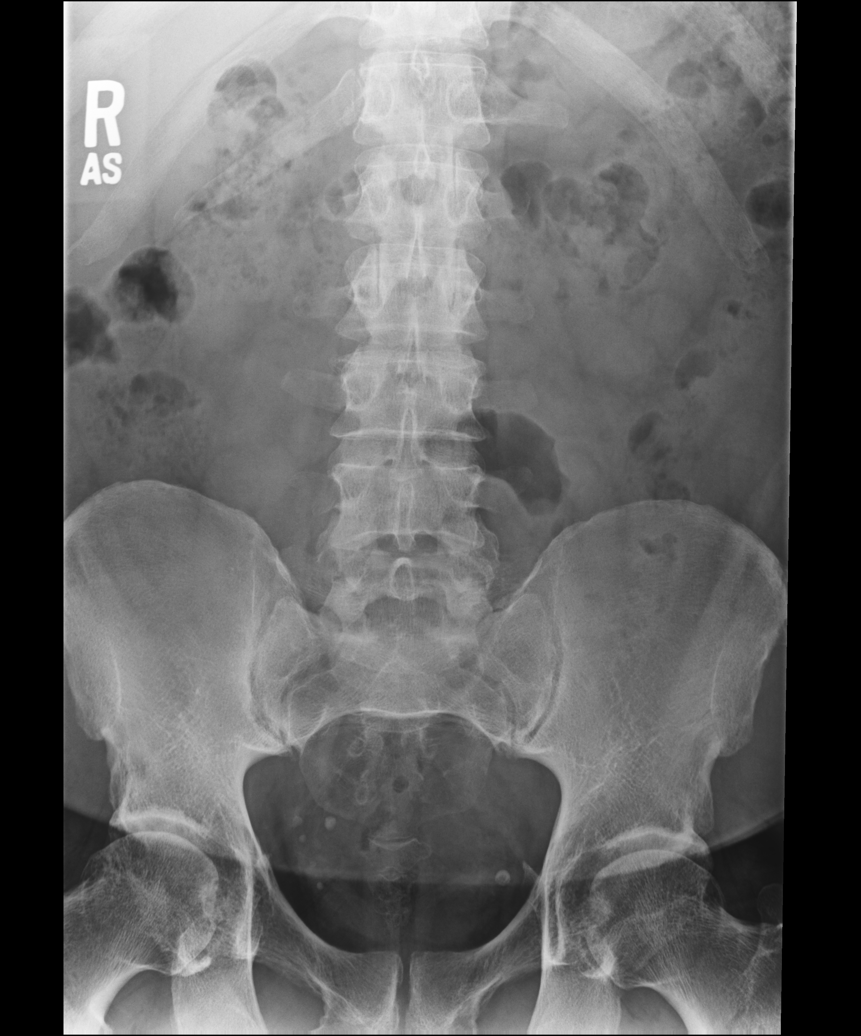

[dg lumbar spine complete 4 +v (2 of 5)]
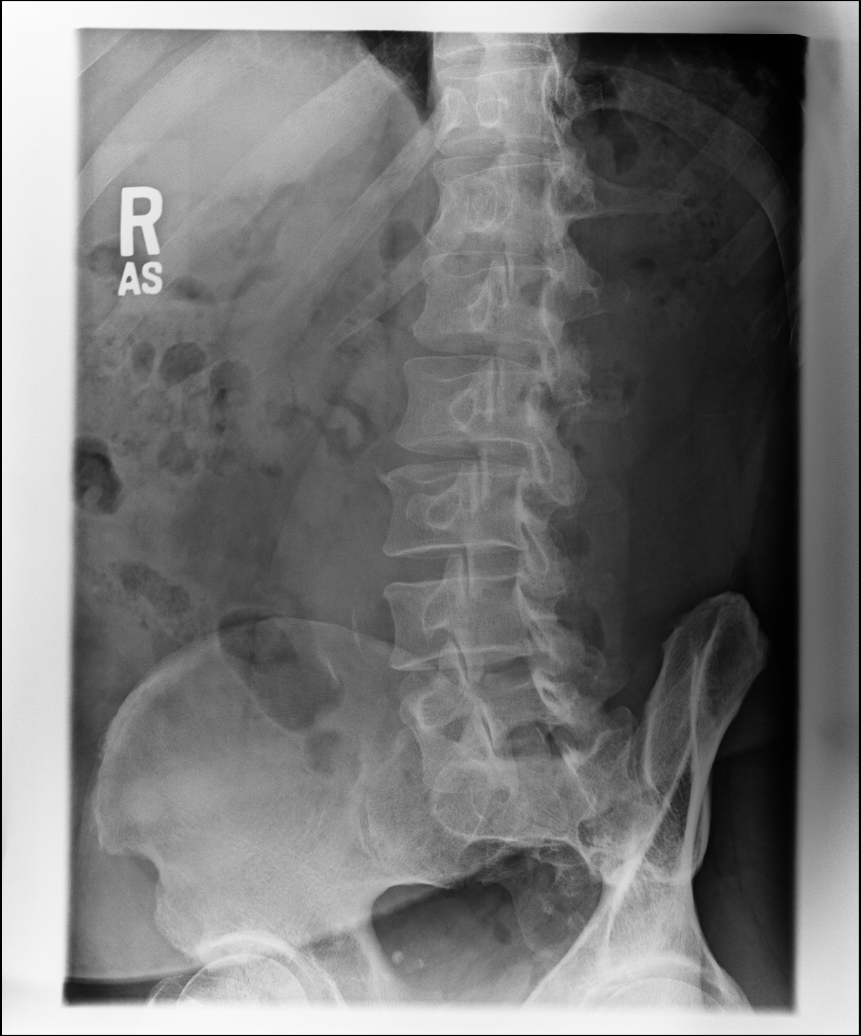

[dg lumbar spine complete 4 +v (3 of 5)]
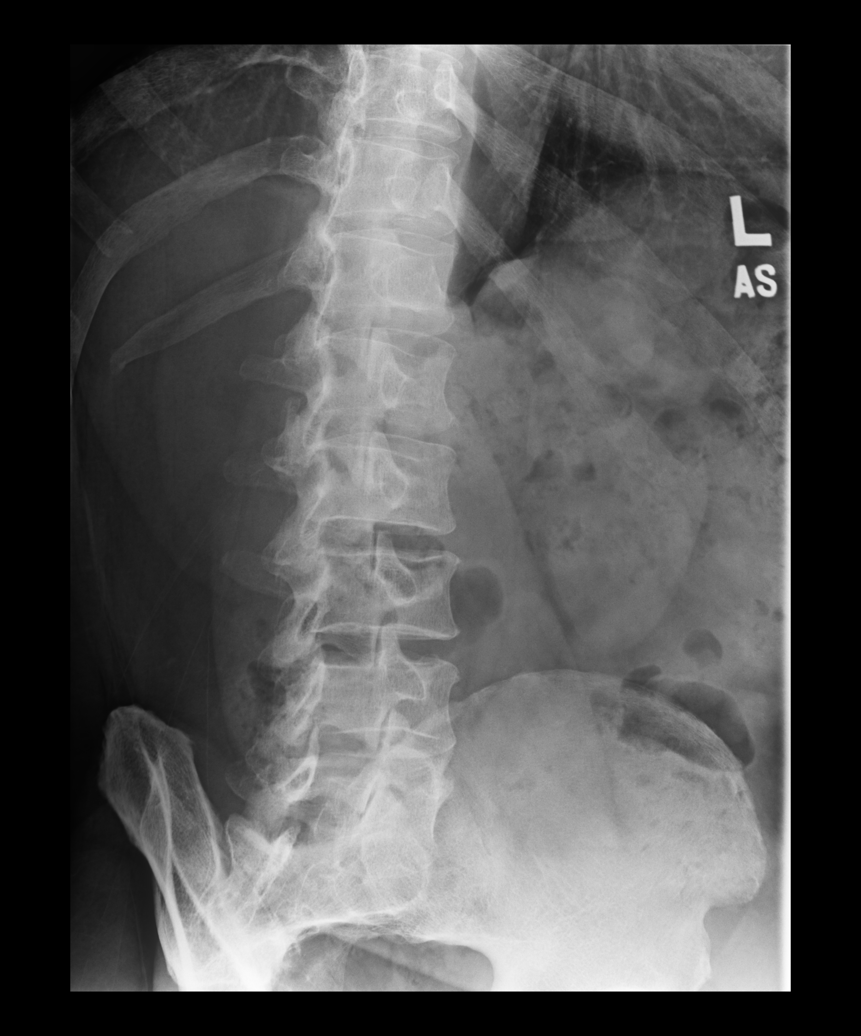

[dg lumbar spine complete 4 +v (4 of 5)]
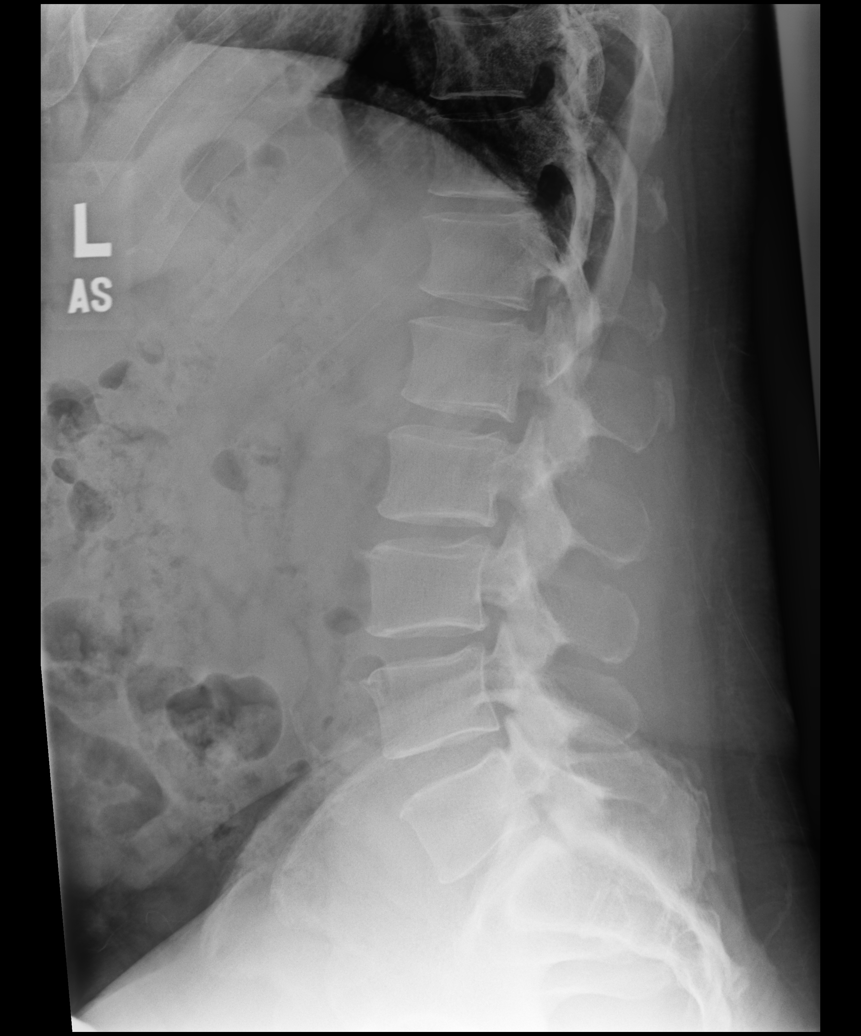

[dg lumbar spine complete 4 +v (5 of 5)]
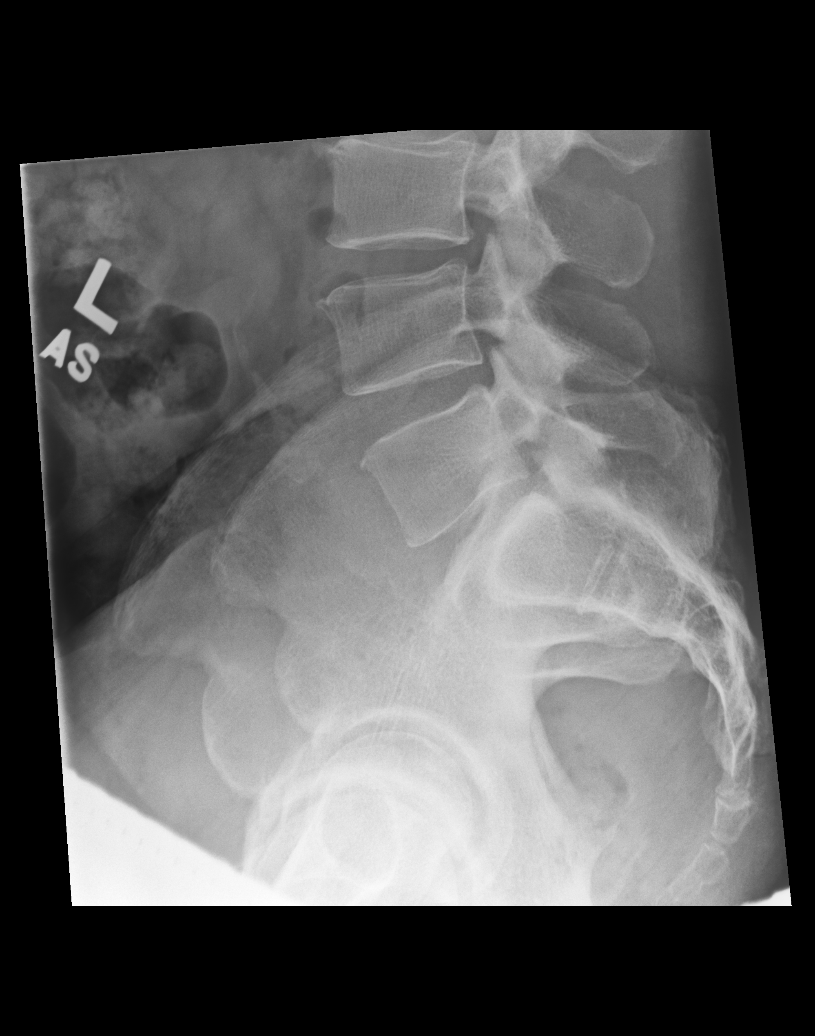

[5 of 5 positions shown; findings below may reference images not displayed]

FINDINGS: Vertebral body alignment, heights and disc space heights are within
normal. There is mild spondylosis of the lumbar spine to include
facet arthropathy over the mid to lower lumbar spine. There is no
compression fracture or spondylolisthesis. Mild degenerate change of
the hips and sacroiliac joints.
IMPRESSION: No acute findings.

Mild spondylosis of the lumbar spine to include facet arthropathy.

## 2022-05-10 ENCOUNTER — Other Ambulatory Visit: Payer: Self-pay | Admitting: Family Medicine

## 2022-05-10 ENCOUNTER — Ambulatory Visit
Admission: RE | Admit: 2022-05-10 | Discharge: 2022-05-10 | Disposition: A | Discharge status: Home / Self Care | Payer: Medicare Other | Source: Ambulatory Visit | Attending: Family Medicine | Admitting: Family Medicine

## 2022-05-10 DIAGNOSIS — M150 Primary generalized (osteo)arthritis: Secondary | ICD-10-CM

## 2024-12-05 ENCOUNTER — Encounter (HOSPITAL_COMMUNITY): Payer: Self-pay | Admitting: Emergency Medicine

## 2024-12-05 ENCOUNTER — Other Ambulatory Visit: Payer: Self-pay

## 2024-12-05 ENCOUNTER — Emergency Department (HOSPITAL_COMMUNITY)
Admission: EM | Admit: 2024-12-05 | Discharge: 2024-12-05 | Disposition: A | Discharge status: Home / Self Care | Attending: Emergency Medicine | Admitting: Emergency Medicine

## 2024-12-05 DIAGNOSIS — S0181XA Laceration without foreign body of other part of head, initial encounter: Secondary | ICD-10-CM | POA: Insufficient documentation

## 2024-12-05 DIAGNOSIS — W25XXXA Contact with sharp glass, initial encounter: Secondary | ICD-10-CM | POA: Insufficient documentation

## 2024-12-05 DIAGNOSIS — Z23 Encounter for immunization: Secondary | ICD-10-CM | POA: Diagnosis not present

## 2024-12-05 MED ORDER — TETANUS-DIPHTH-ACELL PERTUSSIS 5-2-15.5 LF-MCG/0.5 IM SUSP
0.5000 mL | Freq: Once | INTRAMUSCULAR | Status: AC
  Administered 2024-12-05: 0.5 mL via INTRAMUSCULAR
  Filled 2024-12-05: qty 0.5

## 2024-12-05 MED ORDER — LIDOCAINE HCL (PF) 1 % IJ SOLN
30.0000 mL | Freq: Once | INTRAMUSCULAR | Status: AC
  Administered 2024-12-05: 30 mL
  Filled 2024-12-05: qty 30

## 2024-12-05 NOTE — ED Notes (Signed)
 ED Provider at bedside.

## 2024-12-05 NOTE — Discharge Instructions (Addendum)
 Non-asorbable sutures: Keep sutures dry. Please refrain from swimming or bathing. Area can be gently cleaned with mild soap and water after 24 hours. Please return to local urgent care in 5-7 days for suture removal. Failure to remove sutures in timely manner can result in infection.  Seek emergency care if experiencing any new or worsening symptoms like redness or purulence coming from the suture site.  Alternating between 650 mg Tylenol  and 400 mg Advil: The best way to alternate taking Acetaminophen  (example Tylenol ) and Ibuprofen (example Advil/Motrin) is to take them 3 hours apart. For example, if you take ibuprofen at 6 am you can then take Tylenol  at 9 am. You can continue this regimen throughout the day, making sure you do not exceed the recommended maximum dose for each drug.

## 2024-12-05 NOTE — ED Triage Notes (Signed)
 Pt reports throwing some windows in the trash and they broke & cut him on the chin. Approx 1 inch lac on chin. Bleeding controlled.

## 2024-12-05 NOTE — ED Provider Notes (Signed)
 "  EMERGENCY DEPARTMENT AT [facility] Provider Note   CSN: [id number] Arrival date & time: 12/05/24  0144     Patient presents with: Laceration   Luis Roman is a 69 y.o. male with noncontributory PMHx who presents to ED concern for laceration to chin.  Patient was throwing glass away when one of the pieces flew up and hit him in the chin.  Patient denies any other injuries or head trauma.  Patient denies any other recent infectious symptoms such as fever, chest pain, vomiting.  Unsure of last tetanus dose.    Laceration      Prior to Admission medications  Medication Sig Start Date End Date Taking? Authorizing Provider  acetaminophen  (TYLENOL ) 325 MG tablet Take 325-650 mg by mouth every 6 (six) hours as needed (for pain.).    [provider]  methocarbamol  (ROBAXIN ) 500 MG tablet Take 1 tablet (500 mg total) by mouth every 6 (six) hours as needed for muscle spasms. 05/02/17   Ebbie Cough, MD  Multiple Vitamin (MULTIVITAMIN WITH MINERALS) TABS tablet Take 1 tablet by mouth 3 (three) times a week.    [provider]  naproxen sodium (ANAPROX) 220 MG tablet Take 220-440 mg by mouth 2 (two) times daily as needed (for pain.).    [provider]  oxyCODONE  (OXY IR/ROXICODONE ) 5 MG immediate release tablet Take 1 tablet (5 mg total) by mouth every 4 (four) hours as needed for moderate pain. 05/02/17   Ebbie Cough, MD    Allergies: No known allergies    Review of Systems  Skin:  Positive for wound.    Updated Vital Signs BP (!) 153/80   Pulse (!) 57   Temp 98.2 F (36.8 C)   Resp 17   SpO2 98%   Physical Exam Vitals and nursing note reviewed.  Constitutional:      General: He is not in acute distress.    Appearance: He is not ill-appearing or toxic-appearing.  HENT:     Head: Normocephalic and atraumatic.     Comments: 5cm laceration to chin. Hemostatic. Wound depth visualized without concern for foreign  bodies.    Mouth/Throat:     Mouth: Mucous membranes are moist.  Eyes:     General: No scleral icterus.       Right eye: No discharge.        Left eye: No discharge.     Conjunctiva/sclera: Conjunctivae normal.  Cardiovascular:     Rate and Rhythm: Normal rate and regular rhythm.     Pulses: Normal pulses.     Heart sounds: Normal heart sounds.  Pulmonary:     Effort: Pulmonary effort is normal. No respiratory distress.     Breath sounds: Normal breath sounds. No wheezing, rhonchi or rales.  Musculoskeletal:     Right lower leg: No edema.     Left lower leg: No edema.  Skin:    General: Skin is warm and dry.     Findings: No rash.  Neurological:     General: No focal deficit present.     Mental Status: He is alert and oriented to person, place, and time. Mental status is at baseline.  Psychiatric:        Mood and Affect: Mood normal.        Behavior: Behavior normal.     (all labs ordered are listed, but only abnormal results are displayed) Labs Reviewed - No data to display  EKG: None  Radiology: No results  found.   .Laceration Repair  Date/Time: 12/05/2024 3:33 AM  Performed by: Hoy Nidia FALCON, PA-C Authorized by: Hoy Nidia FALCON, PA-C   Consent:    Consent obtained:  Verbal   Consent given by:  Patient and spouse   Risks, benefits, and alternatives were discussed: yes     Risks discussed:  Infection, need for additional repair, nerve damage, pain, poor cosmetic result, poor wound healing, retained foreign body, tendon damage and vascular damage   Alternatives discussed:  No treatment Universal protocol:    Procedure explained and questions answered to patient or proxy's satisfaction: yes     Patient identity confirmed:  Verbally with patient Anesthesia:    Anesthesia method:  Local infiltration   Local anesthetic:  Lidocaine  1% w/o epi Laceration details:    Location:  Face   Face location:  Chin   Length (cm):  5 Treatment:    Area cleansed  with:  Povidone-iodine   Amount of cleaning:  Standard   Irrigation solution:  Sterile saline   Irrigation volume:    Irrigation method:  Pressure wash Skin repair:    Repair method:  Sutures   Suture size:  5-0   Suture material:  Prolene   Suture technique:  Simple interrupted   Number of sutures:  8 Approximation:    Approximation:  Close Repair type:    Repair type:  Simple Post-procedure details:    Dressing:  Antibiotic ointment and non-adherent dressing   Procedure completion:  Tolerated well, no immediate complications    Medications Ordered in the ED  Tdap (ADACEL ) injection 0.5 mL (has no administration in time range)  lidocaine  (PF) (XYLOCAINE ) 1 % injection 30 mL (30 mLs Other Given 12/05/24 0301)                                    Medical Decision Making Risk Prescription drug management.   This patient presents to the ED for concern of a  5 cm simple laceration to their chin, this involves an extensive number of treatment options, and is a complaint that carries with it a high risk of complications and morbidity.  The differential diagnosis includes foreign body, fracture, NV compromise. These are considered less likely due to history of present illness and physical exam findings.   Co morbidities that complicate the patient evaluation  None   Additional history obtained:  Dr. Benjamine PCP   Problem List / ED Course / Critical interventions / Medication management  Patient presented for a 5 cm laceration to their chin. They are neurovascularly intact. Tetanus . Patient is in no distress. Laceration will be repaired with standard wound care procedures and antibiotic ointment. The laceration is not through infected skin, associated with deep puncture wounds, >8hrs old, or grossly contaminated with foreign debris, so I will be using sutures for primary closure.  Patient educated about follow-up with PCP/UC/ED for suture removal and expresses  understanding. Laceration repaired by my PA student supervised by me without complication and with pulse, motor, sensation intact. Patient is stable for discharge at this time.  All education provided in verbal form with additional information in written form.  Staffed with Dr. Midge. I have reviewed the patients home medicines and have made adjustments as needed The patient has been appropriately medically screened and/or stabilized in the ED. I have low suspicion for any other emergent medical condition which would require further screening,  evaluation or treatment in the ED or require inpatient management. At time of discharge the patient is hemodynamically stable and in no acute distress. I have discussed work-up results and diagnosis with patient and answered all questions. Patient is agreeable with discharge plan. We discussed strict return precautions for returning to the emergency department and they verbalized understanding.     Social Determinants of Health:  geriatric        Final diagnoses:  Facial laceration, initial encounter    ED Discharge Orders     None          Hoy Nidia FALCON, [place] 12/05/24 [id number]    Midge Golas, MD 12/05/24 [phone]  "
